# Patient Record
Sex: Female | Born: 1995 | Race: Black or African American | Marital: Single | Smoking: Never smoker
Health system: Southern US, Community
[De-identification: ages and names within clinical notes are randomized; demographics above are authoritative.]

---

## 2017-04-05 ENCOUNTER — Emergency Department: Payer: Self-pay

## 2017-04-05 ENCOUNTER — Encounter: Payer: Self-pay | Admitting: Emergency Medicine

## 2017-04-05 ENCOUNTER — Emergency Department
Admission: EM | Admit: 2017-04-05 | Discharge: 2017-04-05 | Disposition: A | Discharge status: Home / Self Care | Payer: Self-pay | Attending: Emergency Medicine | Admitting: Emergency Medicine

## 2017-04-05 DIAGNOSIS — W230XXA Caught, crushed, jammed, or pinched between moving objects, initial encounter: Secondary | ICD-10-CM | POA: Insufficient documentation

## 2017-04-05 DIAGNOSIS — S60151A Contusion of right little finger with damage to nail, initial encounter: Secondary | ICD-10-CM | POA: Insufficient documentation

## 2017-04-05 DIAGNOSIS — S6991XA Unspecified injury of right wrist, hand and finger(s), initial encounter: Secondary | ICD-10-CM

## 2017-04-05 DIAGNOSIS — Y999 Unspecified external cause status: Secondary | ICD-10-CM | POA: Insufficient documentation

## 2017-04-05 DIAGNOSIS — Y939 Activity, unspecified: Secondary | ICD-10-CM | POA: Insufficient documentation

## 2017-04-05 DIAGNOSIS — Y929 Unspecified place or not applicable: Secondary | ICD-10-CM | POA: Insufficient documentation

## 2017-04-05 DIAGNOSIS — S6010XA Contusion of unspecified finger with damage to nail, initial encounter: Secondary | ICD-10-CM

## 2017-04-05 MED ORDER — BACITRACIN ZINC 500 UNIT/GM EX OINT
TOPICAL_OINTMENT | Freq: Two times a day (BID) | CUTANEOUS | Status: DC
  Administered 2017-04-05: 16:00:00 via TOPICAL
  Filled 2017-04-05: qty 0.9

## 2017-04-05 MED ORDER — IBUPROFEN 600 MG PO TABS
600.0000 mg | ORAL_TABLET | Freq: Once | ORAL | Status: AC
  Administered 2017-04-05: 600 mg via ORAL
  Filled 2017-04-05: qty 1

## 2017-04-05 NOTE — ED Provider Notes (Signed)
[facility] Emergency Department Provider Note  ____________________________________________  Time seen: Approximately 1:59 PM  I have reviewed the triage vital signs and the nursing notes.   HISTORY  Chief Complaint Finger Injury    HPI Melinda Dunn is a 21 y.o. female that presents to the emergency department with right finger injury. Patient slammed her finger in the car door today. Pain is described as throbbing. She is moving finger but with pain. She denies additional injuries. She has not taken anything for pain. She denies shortness of breath, chest pain, nausea, vomiting, abdominal pain.   History reviewed. No pertinent past medical history.  There are no active problems to display for this patient.   History reviewed. No pertinent surgical history.  Prior to Admission medications   Not on File    Allergies Patient has no known allergies.  No family history on file.  Social History Social History  Substance Use Topics  . Smoking status: Never Smoker  . Smokeless tobacco: Not on file  . Alcohol use Not on file     Review of Systems  Constitutional: No fever/chills Cardiovascular: No chest pain. Respiratory: No SOB. Gastrointestinal: No abdominal pain.  No nausea, no vomiting.  Musculoskeletal: As needed for finger pain. Skin: Negative for rash, lacerations Neurological: Negative for headaches, numbness or tingling   ____________________________________________   PHYSICAL EXAM:  VITAL SIGNS: ED Triage Vitals  Enc Vitals Group     BP 04/05/17 1256 122/72     Pulse Rate 04/05/17 1256 84     Resp 04/05/17 1256 18     Temp 04/05/17 1256 97.9 F (36.6 C)     Temp Source 04/05/17 1256 Oral     SpO2 04/05/17 1256 98 %     Weight 04/05/17 1258 120 lb (54.4 kg)     Height 04/05/17 1258  (1.626 m)     Head Circumference --      Peak Flow --      Pain Score 04/05/17 1256 10     Pain Loc --      Pain Edu? --       Excl. in GC? --      Constitutional: Alert and oriented. Well appearing and in no acute distress. Eyes: Conjunctivae are normal. PERRL. EOMI. Head: Atraumatic. ENT:      Ears:      Nose: No congestion/rhinnorhea.      Mouth/Throat: Mucous membranes are moist.  Neck: No stridor.  Cardiovascular: Normal rate, regular rhythm.  Good peripheral circulation. Respiratory: Normal respiratory effort without tachypnea or retractions. Lungs CTAB. Good air entry to the bases with no decreased or absent breath sounds. Gastrointestinal: Bowel sounds 4 quadrants. Soft and nontender to palpation. No guarding or rigidity. No palpable masses. No distention.  Musculoskeletal: Full range of motion to all extremities. No gross deformities appreciated. Neurologic:  Normal speech and language. No gross focal neurologic deficits are appreciated.  Skin:  Skin is warm, dry. Blood under the pinky fingernail. Bruising at base of the fingernail.   ____________________________________________   LABS (all labs ordered are listed, but only abnormal results are displayed)  Labs Reviewed - No data to display ____________________________________________  EKG   ____________________________________________  RADIOLOGY Lexine Baton, personally viewed and evaluated these images (plain radiographs) as part of my medical decision making, as well as reviewing the written report by the radiologist.  Dg Finger Little Right  Result Date: 04/05/2017 CLINICAL DATA:  Slammed distal right pinky finger in car  door today. Pain base of fingernail with bruising. EXAM: RIGHT LITTLE FINGER 2+V COMPARISON:  None. FINDINGS: There is no evidence of fracture or dislocation. There is no evidence of arthropathy or other focal bone abnormality. Soft tissues are unremarkable. IMPRESSION: Negative. Electronically Signed   By: Amie Portland M.D.   On: 04/05/2017 13:34     ____________________________________________    PROCEDURES  Procedure(s) performed:    Procedures  Trephination was performed with 18-gauge needle. Blood was drained. Patient tolerated the procedure well.   Medications  ibuprofen (ADVIL,MOTRIN) tablet 600 mg (not administered)  bacitracin ointment (not administered)     ____________________________________________   INITIAL IMPRESSION / ASSESSMENT AND PLAN / ED COURSE  Pertinent labs & imaging results that were available during my care of the patient were reviewed by me and considered in my medical decision making (see chart for details).  Review of the [place] CSRS was performed in accordance of the [facility] prior to dispensing any controlled drugs.     Patient's diagnosis is consistent with subungual hematoma and finger injury. Vital signs and exam are reassuring. X-ray negative for acute bony abnormalities. Trephination was performed in ED and blood was drained. Sprlint was applied. Patient is to follow up with PCP as directed. Patient is given ED precautions to return to the ED for any worsening or new symptoms.     ____________________________________________  FINAL CLINICAL IMPRESSION(S) / ED DIAGNOSES  Final diagnoses:  Subungual hematoma of finger, initial encounter  Injury of finger of right hand, initial encounter      NEW MEDICATIONS STARTED DURING THIS VISIT:  New Prescriptions   No medications on file        This chart was dictated using voice recognition software/Dragon. Despite best efforts to proofread, errors can occur which can change the meaning. Any change was purely unintentional.    Enid Derry, PA-C 04/05/17 1555    Jene Every, MD 04/07/17 [phone]

## 2017-04-05 NOTE — ED Triage Notes (Signed)
Smashed R fifth digit in car door approx 30 minutes ago, painful.

## 2018-08-29 IMAGING — CR DG FINGER LITTLE 2+V*R*
3 series · 3 of 3 positions shown · non-contrast
Comparison: None.

CLINICAL DATA: Slammed distal right pinky finger in car door today.
Pain base of fingernail with bruising.

EXAM:
RIGHT LITTLE FINGER 2+V

[finger ap]
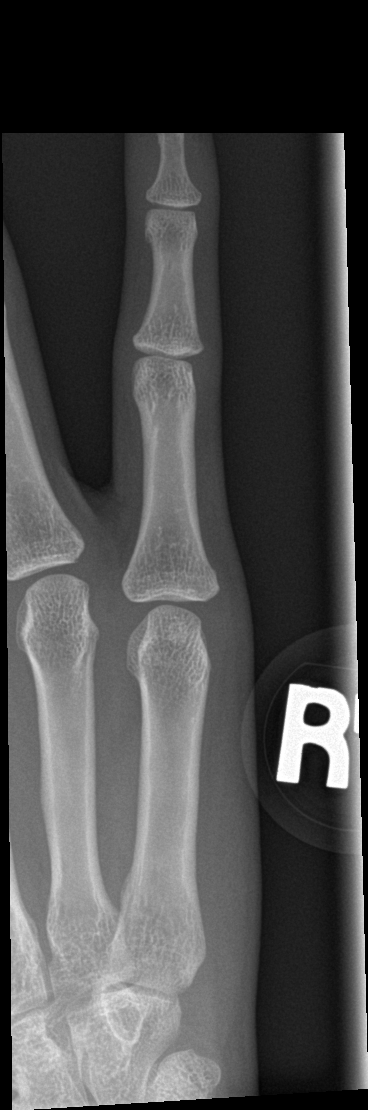

[finger obl]
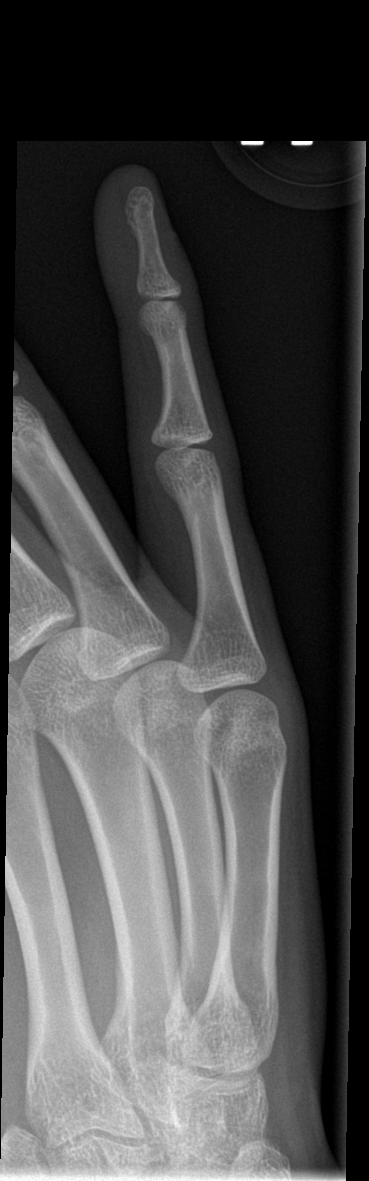

[finger lat]
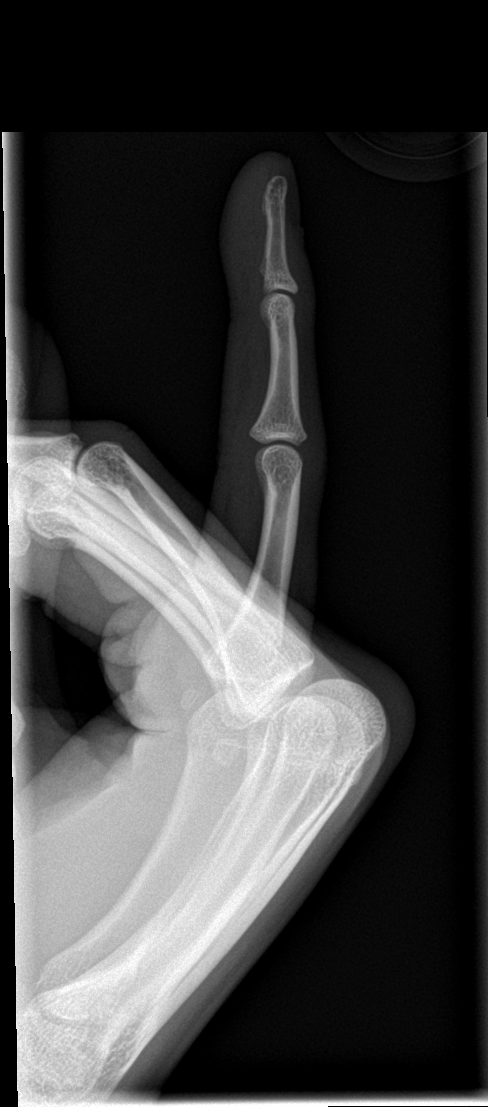

[3 of 3 positions shown; findings below may reference images not displayed]

FINDINGS: There is no evidence of fracture or dislocation. There is no
evidence of arthropathy or other focal bone abnormality. Soft
tissues are unremarkable.
IMPRESSION: Negative.
